# Patient Record
Sex: Male | Born: 1995 | Race: Black or African American | Hispanic: No | Marital: Single | Smoking: Current every day smoker
Health system: Southern US, Community
[De-identification: ages and names within clinical notes are randomized; demographics above are authoritative.]

## PROBLEM LIST (undated history)

## (undated) HISTORY — PX: OTHER SURGICAL HISTORY: SHX169

---
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
## 2017-01-06 ENCOUNTER — Encounter (HOSPITAL_COMMUNITY): Payer: Self-pay | Admitting: Emergency Medicine

## 2017-01-06 ENCOUNTER — Emergency Department (HOSPITAL_COMMUNITY)
Admission: EM | Admit: 2017-01-06 | Discharge: 2017-01-06 | Payer: Self-pay | Attending: Emergency Medicine | Admitting: Emergency Medicine

## 2017-01-06 DIAGNOSIS — S30851A Superficial foreign body of abdominal wall, initial encounter: Secondary | ICD-10-CM | POA: Insufficient documentation

## 2017-01-06 DIAGNOSIS — T754XXA Electrocution, initial encounter: Secondary | ICD-10-CM

## 2017-01-06 DIAGNOSIS — Y9389 Activity, other specified: Secondary | ICD-10-CM | POA: Insufficient documentation

## 2017-01-06 DIAGNOSIS — Y929 Unspecified place or not applicable: Secondary | ICD-10-CM | POA: Insufficient documentation

## 2017-01-06 DIAGNOSIS — Y998 Other external cause status: Secondary | ICD-10-CM | POA: Insufficient documentation
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
## 2017-04-24 ENCOUNTER — Other Ambulatory Visit: Payer: Self-pay

## 2017-04-24 ENCOUNTER — Emergency Department (HOSPITAL_COMMUNITY): Payer: Self-pay

## 2017-04-24 ENCOUNTER — Emergency Department (HOSPITAL_COMMUNITY)
Admission: EM | Admit: 2017-04-24 | Discharge: 2017-04-24 | Disposition: A | Discharge status: Home / Self Care | Payer: Self-pay | Attending: Emergency Medicine | Admitting: Emergency Medicine

## 2017-04-24 DIAGNOSIS — R7989 Other specified abnormal findings of blood chemistry: Secondary | ICD-10-CM | POA: Insufficient documentation

## 2017-04-24 DIAGNOSIS — R55 Syncope and collapse: Secondary | ICD-10-CM | POA: Insufficient documentation

## 2017-04-24 LAB — CBC
HCT: 41 % (ref 39.0–52.0)
Hemoglobin: 13.5 g/dL (ref 13.0–17.0)
MCH: 29.7 pg (ref 26.0–34.0)
MCHC: 32.9 g/dL (ref 30.0–36.0)
MCV: 90.3 fL (ref 78.0–100.0)
Platelets: 209 10*3/uL (ref 150–400)
RBC: 4.54 MIL/uL (ref 4.22–5.81)
RDW: 13.7 % (ref 11.5–15.5)
WBC: 6.2 10*3/uL (ref 4.0–10.5)

## 2017-04-24 LAB — URINALYSIS, ROUTINE W REFLEX MICROSCOPIC
BILIRUBIN URINE: NEGATIVE
Glucose, UA: NEGATIVE mg/dL
Hgb urine dipstick: NEGATIVE
KETONES UR: NEGATIVE mg/dL
LEUKOCYTES UA: NEGATIVE
NITRITE: NEGATIVE
PROTEIN: NEGATIVE mg/dL
Specific Gravity, Urine: 1.03 (ref 1.005–1.030)
pH: 6 (ref 5.0–8.0)

## 2017-04-24 LAB — BASIC METABOLIC PANEL
Anion gap: 11 (ref 5–15)
BUN: 15 mg/dL (ref 6–20)
CALCIUM: 9 mg/dL (ref 8.9–10.3)
CO2: 22 mmol/L (ref 22–32)
Chloride: 106 mmol/L (ref 101–111)
Creatinine, Ser: 1.72 mg/dL — ABNORMAL HIGH (ref 0.61–1.24)
GFR, EST NON AFRICAN AMERICAN: 55 mL/min — AB (ref >60)
Glucose, Bld: 91 mg/dL (ref 65–99)
Potassium: 4.1 mmol/L (ref 3.5–5.1)
SODIUM: 139 mmol/L (ref 135–145)

## 2017-04-24 LAB — CBG MONITORING, ED: GLUCOSE-CAPILLARY: 90 mg/dL (ref 65–99)

## 2017-04-24 MED ORDER — SODIUM CHLORIDE 0.9 % IV BOLUS (SEPSIS)
1000.0000 mL | Freq: Once | INTRAVENOUS | Status: AC
  Administered 2017-04-24: 1000 mL via INTRAVENOUS

## 2017-04-24 NOTE — ED Notes (Signed)
Patient denies pain and is resting comfortably.  

## 2017-04-24 NOTE — ED Provider Notes (Signed)
[facility] EMERGENCY DEPARTMENT Provider Note   CSN: [id number] Arrival date & time: 04/24/17  0105     History   Chief Complaint Chief Complaint  Patient presents with  . Loss of Consciousness    HPI Edwin Ellis is a 22 y.o. male.  Patient with history of Wolff-Parkinson-White, 3 previous ablations last performed 5 years ago --presents with complaint of syncopal episode.  Patient was at work when he began to feel lightheaded.  He excused himself from a meeting in route to the hall where he had a syncopal episode.  Fall was witnessed and patient did not hit his head.  He woke up about 15 seconds later and was not confused.  Patient does not report any associated chest pain or shortness of breath.  He was concerned that this was related to his previous WPW.  Patient states that he works at nighttime and does not been sleeping well.  He states that he has been eating and drinking well.  No reported fevers, cough, recent URI symptoms in the past month.  No nausea, vomiting, or diarrhea.  Patient states that he has been eating and drinking normally. The onset of this condition was acute. The course is constant. Aggravating factors: none. Alleviating factors: none.        No past medical history on file.  There are no active problems to display for this patient.   No past surgical history on file.     Home Medications    Prior to Admission medications   Not on File    Family History No family history on file.  Social History Social History   Tobacco Use  . Smoking status: Not on file  Substance Use Topics  . Alcohol use: Not on file  . Drug use: Not on file     Allergies   Penicillins   Review of Systems Review of Systems  Constitutional: Negative for fever.  HENT: Negative for rhinorrhea and sore throat.   Eyes: Negative for redness.  Respiratory: Negative for cough.   Cardiovascular: Negative for chest pain.  Gastrointestinal:  Negative for abdominal pain, diarrhea, nausea and vomiting.  Genitourinary: Negative for dysuria.  Musculoskeletal: Negative for myalgias.  Skin: Negative for rash.  Neurological: Positive for syncope and light-headedness. Negative for headaches.     Physical Exam Updated Vital Signs BP 116/68   Pulse 70   Temp 98.9 F (37.2 C) (Oral)   Resp 16   Ht 6\' 1"  (1.854 m)   Wt 86.2 kg (190 lb)   SpO2 98%   BMI 25.07 kg/m   Physical Exam  Constitutional: He appears well-developed and well-nourished.  HENT:  Head: Normocephalic and atraumatic.  Mouth/Throat: Mucous membranes are normal. Mucous membranes are not dry.  Eyes: Conjunctivae are normal.  Neck: Trachea normal and normal range of motion. Neck supple. Normal carotid pulses and no JVD present. No muscular tenderness present. Carotid bruit is not present. No tracheal deviation present.  Cardiovascular: Normal rate, regular rhythm, S1 normal, S2 normal, normal heart sounds and intact distal pulses. Exam reveals no distant heart sounds and no decreased pulses.  No murmur heard. Pulmonary/Chest: Effort normal and breath sounds normal. No respiratory distress. He has no wheezes. He exhibits no tenderness.  Abdominal: Soft. Normal aorta and bowel sounds are normal. There is no tenderness. There is no rebound and no guarding.  Musculoskeletal: He exhibits no edema.  Neurological: He is alert.  Skin: Skin is warm and dry. He  is not diaphoretic. No cyanosis. No pallor.  Psychiatric: He has a normal mood and affect.  Nursing note and vitals reviewed.    ED Treatments / Results  Labs (all labs ordered are listed, but only abnormal results are displayed) Labs Reviewed  BASIC METABOLIC PANEL - Abnormal; Notable for the following components:      Result Value   Creatinine, Ser 1.72 (*)    GFR calc non Af Amer 55 (*)    All other components within normal limits  CBC  URINALYSIS, ROUTINE W REFLEX MICROSCOPIC  CBG MONITORING, ED     EKG  EKG Interpretation  Date/Time:  Friday April 24 2017 01:06:04 EST Ventricular Rate:  73 PR Interval:    QRS Duration: 102 QT Interval:  376 QTC Calculation: 415 R Axis:   74 Text Interpretation:  Sinus rhythm Benign early repolarization Otherwise within normal limits Confirmed by Gilda CreasePollina, Christopher J [id number]) on 04/24/2017 2:37:47 AM       Radiology Dg Chest 2 View  Result Date: 04/24/2017 CLINICAL DATA:  Syncope.  History of atrial fibrillation. EXAM: CHEST - 2 VIEW COMPARISON:  None. FINDINGS: The cardiomediastinal contours are normal. The lungs are clear. Pulmonary vasculature is normal. No consolidation, pleural effusion, or pneumothorax. No acute osseous abnormalities are seen. IMPRESSION: No acute pulmonary process. Electronically Signed   By: Rubye OaksMelanie  Ehinger M.D.   On: 04/24/2017 03:08    Procedures Procedures (including critical care time)  Medications Ordered in ED Medications  sodium chloride 0.9 % bolus 1,000 mL (0 mLs Intravenous Stopped 04/24/17 0615)     Initial Impression / Assessment and Plan / ED Course  I have reviewed the triage vital signs and the nursing notes.  Pertinent labs & imaging results that were available during my care of the patient were reviewed by me and considered in my medical decision making (see chart for details).     Patient seen and examined. Work-up initiated. EKG reviewed with Dr. Blinda LeatherwoodPollina. Plan discussed.   Vital signs reviewed and are as follows: BP 115/62   Pulse 65   Temp 98.9 F (37.2 C) (Oral)   Resp 19   Ht 6\' 1"  (1.854 m)   Wt 86.2 kg (190 lb)   SpO2 99%   BMI 25.07 kg/m   6:42 AM patient stable in the emergency department after several hours of monitoring.  He has received 1 L normal saline.  We discussed elevated serum creatinine level.  This may be due to dehydration and patient encouraged to maintain good fluid hydration at home.  Discussed need to have this rechecked in the next 1-2 weeks.  Patient  verbalizes understanding.  Also discussed cardiology referral given history of WPW and no episode of syncope.  Patient strongly encouraged follow-up.  Encouraged return to the emergency department with new symptoms including chest pain, shortness of breath, additional episodes of passing out.  He verbalizes understanding and agrees with plan.  He is comfortable discharged home.  Final Clinical Impressions(s) / ED Diagnoses   Final diagnoses:  Syncope, unspecified syncope type  Elevated serum creatinine   Patient with syncopal episode with prodrome.  He does have history of WPW but no concerning EKG findings today.  He has no chest pain or findings consistent with pericarditis or myocarditis.  No recent viral illness.  No fluid overload on chest x-ray to suggest heart failure.  He is back to his baseline in emergency department.  No arrhythmias during several hours of monitoring in the emergency department.  He was found to have elevated serum creatinine.  This may be indicative of dehydration.  Patient was given IV fluids for this.  Discussed need to have this rechecked as above.  Return instructions as above.  Patient seems reliable to return with additional symptoms or worsening symptoms.   ED Discharge Orders    None       Renne Crigler, PA-C 04/24/17 0645    Gilda Crease, MD 04/24/17 [phone]

## 2017-04-24 NOTE — ED Triage Notes (Signed)
Pt arrived via [facility] from work; had episode of syncope with LOC, states episode felt like past experiences of AFIB for which he has had 3 ablations  Pt never in AFIB for [facility]; VS normal

## 2017-04-24 NOTE — Discharge Instructions (Signed)
Please read and follow all provided instructions.  Your diagnoses today include:  1. Syncope, unspecified syncope type   2. Elevated serum creatinine     Tests performed today include:  EKG -no sign of WPW today  Counts and electrolytes -elevated kidney function test, this will need to be rechecked by primary care doctor in the next 1-2 weeks  Vital signs. See below for your results today.    Medications prescribed:   None  Take any prescribed medications only as directed.  Home care instructions:  Follow any educational materials contained in this packet.  Follow-up instructions: Please follow-up with your primary care provider in the next 7 days for further evaluation of your symptoms.  You should also establish care with cardiologist given your history of arrhythmia and new episode of passing out.  Return instructions:   Please return to the Emergency Department if you experience worsening symptoms.   Return with chest pain, shortness of breath, additional episodes of passing out.   Please return if you have any other emergent concerns.  Additional Information:  Your vital signs today were: BP (!) 119/58    Pulse 65    Temp 98.9 F (37.2 C) (Oral)    Resp (!) 22    Ht 6\' 1"  (1.854 m)    Wt 86.2 kg (190 lb)    SpO2 99%    BMI 25.07 kg/m  If your blood pressure (BP) was elevated above 135/85 this visit, please have this repeated by your doctor within one month. --------------

## 2017-04-24 NOTE — ED Notes (Signed)
Delay in lab draw,  PA at bedside. 

## 2017-04-24 NOTE — ED Notes (Signed)
PA at bedside.

## 2017-04-24 NOTE — ED Notes (Signed)
Pt aware that we need urine sample. 

## 2017-05-01 NOTE — ED Notes (Signed)
Pt. Arrived and requested a new work note,.  Pt. Lost his original one.  Pt. Given another work note. 05/01/2017, 14:40

## 2018-11-17 IMAGING — CR DG CHEST 2V
2 series · 2 of 2 positions shown · non-contrast
Comparison: None.

CLINICAL DATA: Syncope.  History of atrial fibrillation.

EXAM:
CHEST - 2 VIEW

[chest lat]
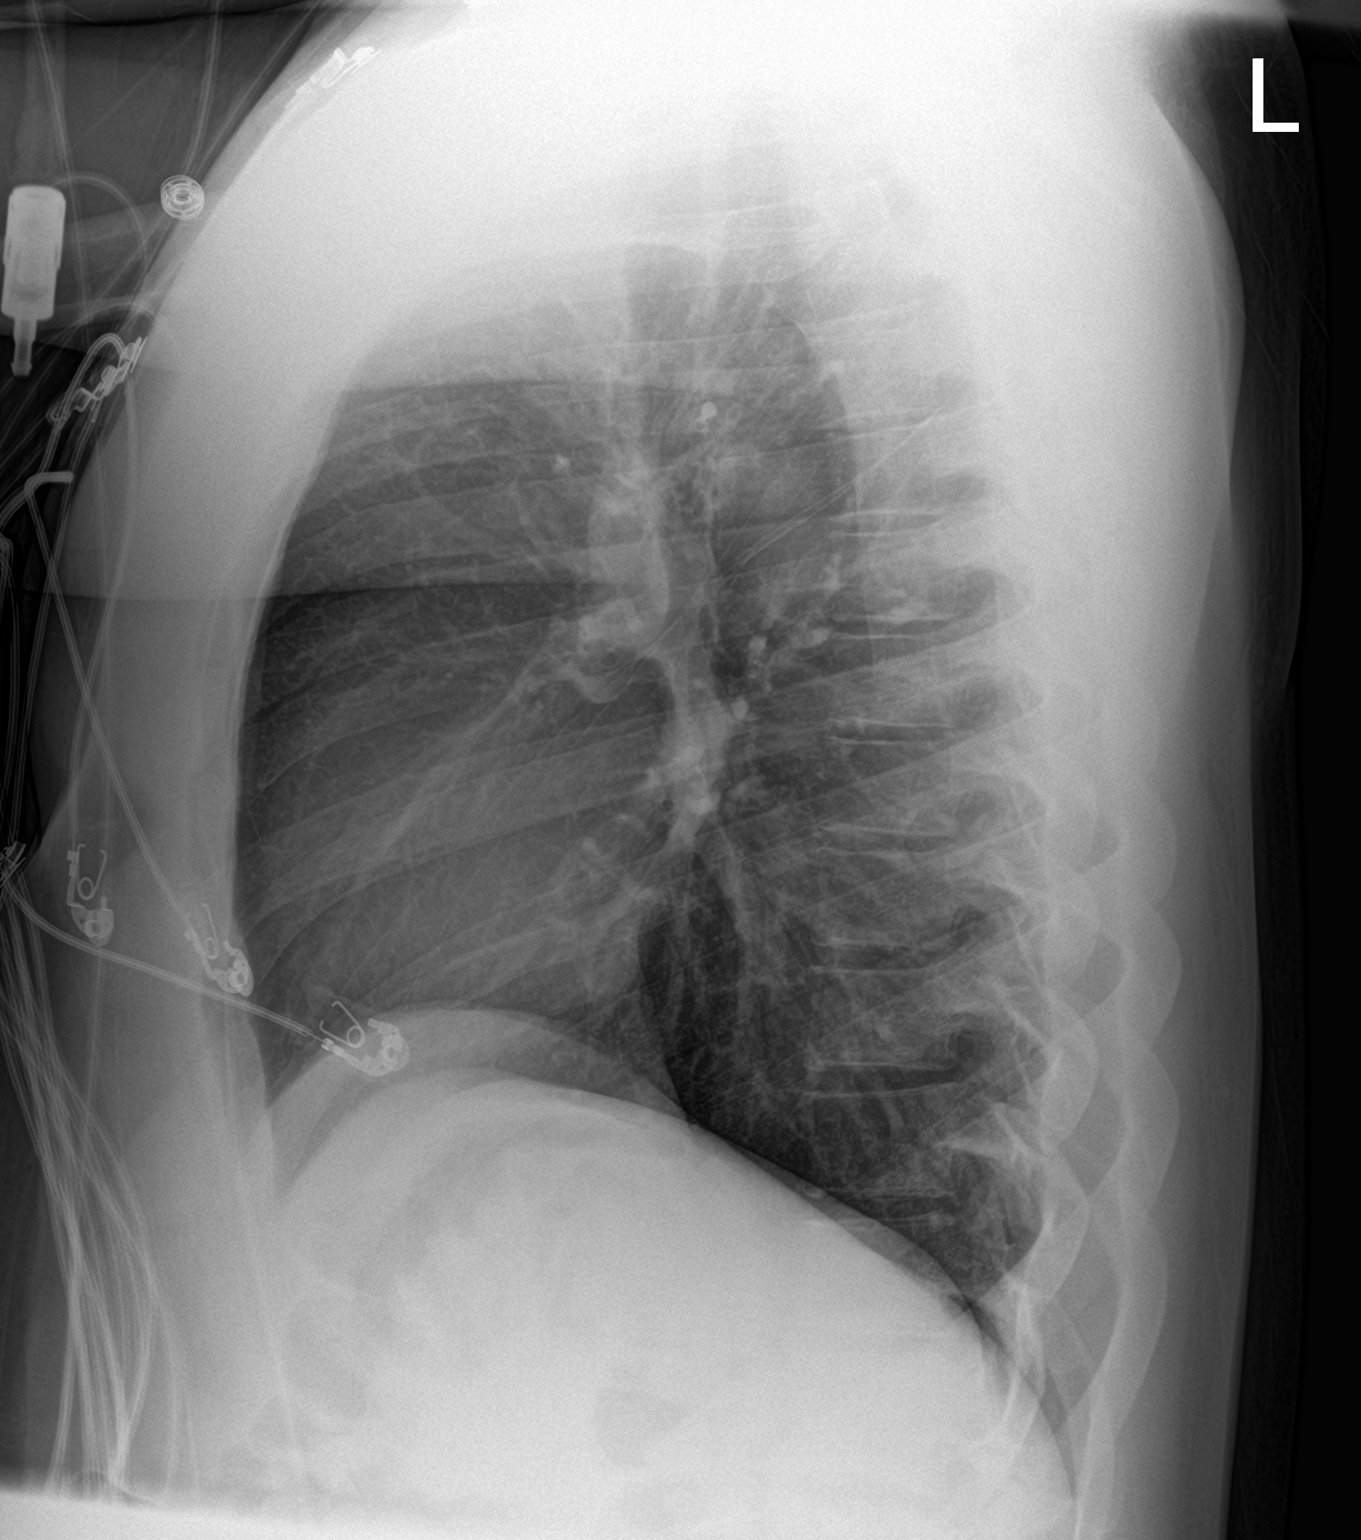

[chest ap]
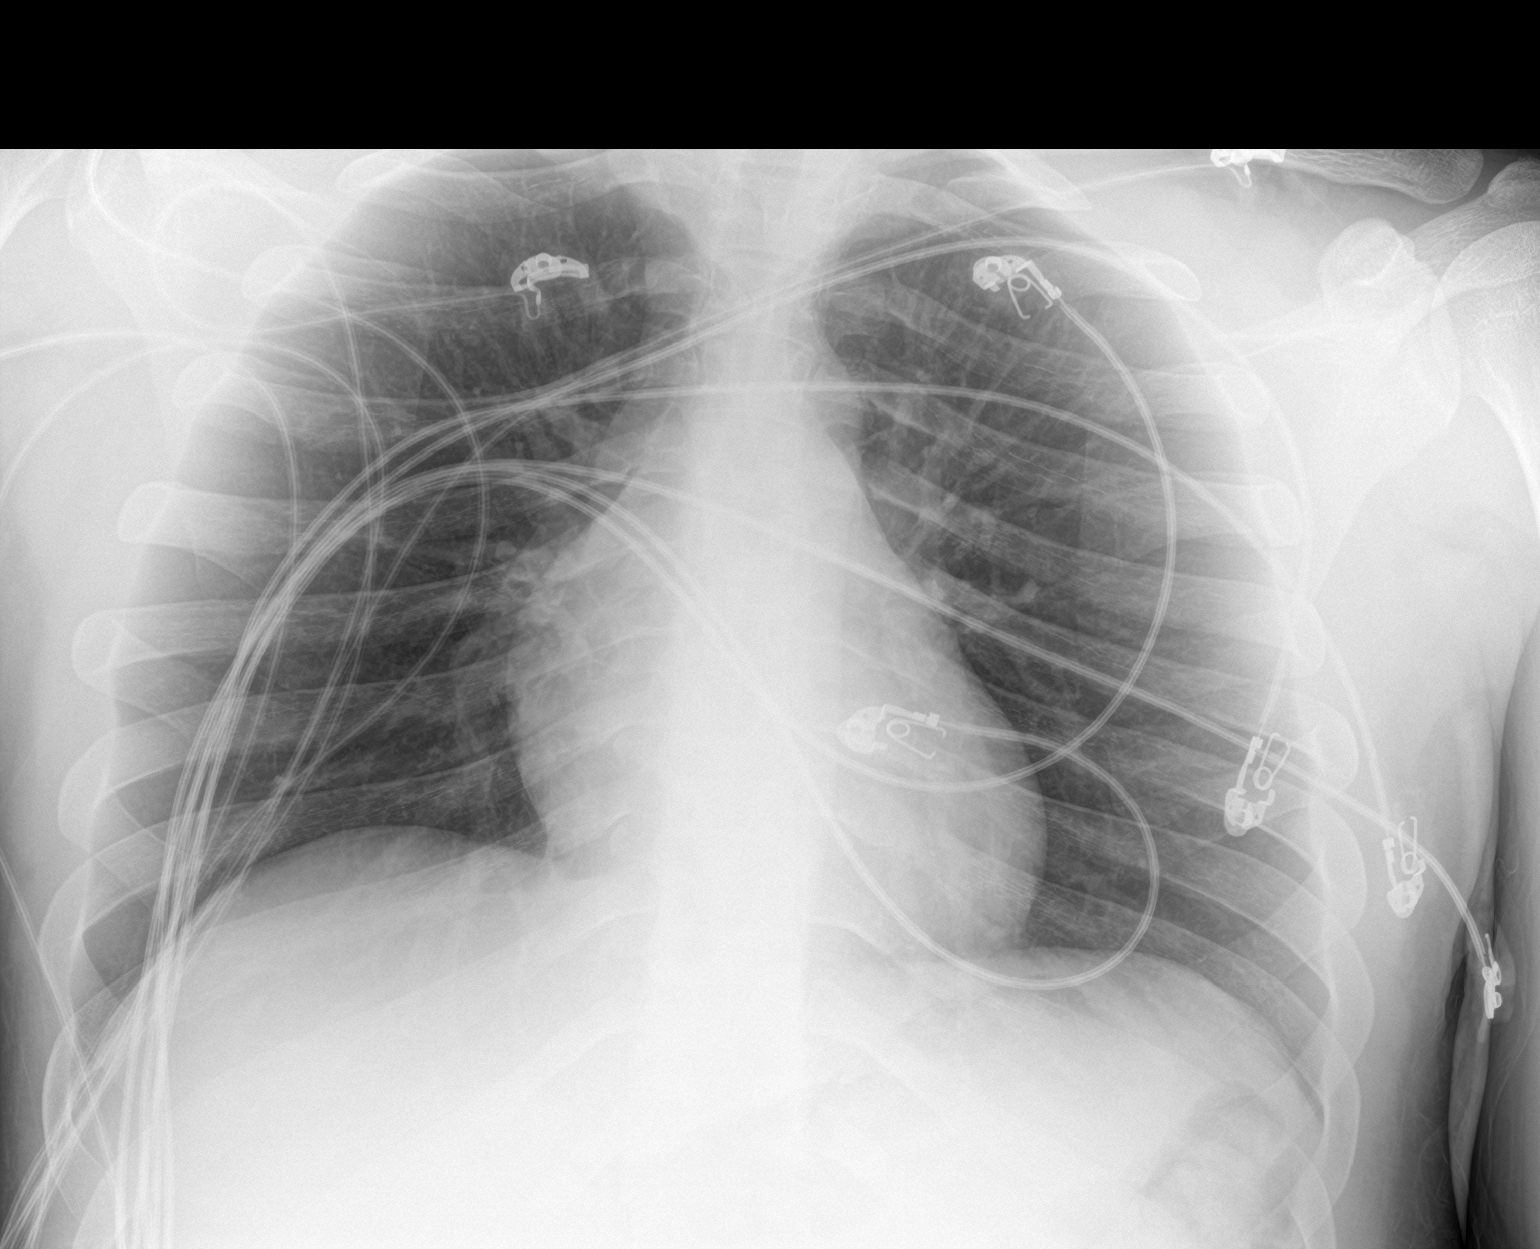

[2 of 2 positions shown; findings below may reference images not displayed]

FINDINGS: The cardiomediastinal contours are normal. The lungs are clear.
Pulmonary vasculature is normal. No consolidation, pleural effusion,
or pneumothorax. No acute osseous abnormalities are seen.
IMPRESSION: No acute pulmonary process.

## 2023-06-18 ENCOUNTER — Other Ambulatory Visit: Payer: Self-pay

## 2023-06-18 ENCOUNTER — Encounter (HOSPITAL_COMMUNITY): Payer: Self-pay

## 2023-06-18 ENCOUNTER — Ambulatory Visit (HOSPITAL_COMMUNITY)
Admission: EM | Admit: 2023-06-18 | Discharge: 2023-06-18 | Disposition: A | Discharge status: Home / Self Care | Attending: Emergency Medicine | Admitting: Emergency Medicine

## 2023-06-18 DIAGNOSIS — K0510 Chronic gingivitis, plaque induced: Secondary | ICD-10-CM | POA: Diagnosis not present

## 2023-06-18 MED ORDER — NYSTATIN 100000 UNIT/ML MT SUSP: 5.0000 mL | Freq: Four times a day (QID) | OROMUCOSAL | 0 refills | Status: DC | PRN

## 2023-06-18 MED ORDER — CHLORHEXIDINE GLUCONATE 0.12 % MT SOLN: 15.0000 mL | Freq: Two times a day (BID) | OROMUCOSAL | 0 refills | Status: DC
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
## 2023-06-18 NOTE — Discharge Instructions (Addendum)
 Avoid using charcoal toothpaste in the future to prevent any further irritation. Use Peridex  mouthwash twice daily. Use Magic mouthwash 4 times daily as needed for discomfort. Return here if symptoms persist or worsen.

## 2023-06-30 ENCOUNTER — Encounter (HOSPITAL_COMMUNITY): Payer: Self-pay | Admitting: *Deleted

## 2023-06-30 ENCOUNTER — Ambulatory Visit (HOSPITAL_COMMUNITY)
Admission: EM | Admit: 2023-06-30 | Discharge: 2023-06-30 | Disposition: A | Discharge status: Home / Self Care | Attending: Family Medicine | Admitting: Family Medicine

## 2023-06-30 DIAGNOSIS — Z113 Encounter for screening for infections with a predominantly sexual mode of transmission: Secondary | ICD-10-CM | POA: Diagnosis present

## 2023-06-30 NOTE — ED Triage Notes (Signed)
 Pt states he came a couple weeks ago for tingling in his mouth he has been using the meds given but its not helped any. He states he is also having a slight cough. He states he is having oral sex and wants STI testing orally and penile.

## 2023-06-30 NOTE — ED Provider Notes (Signed)
 [facility]   [id number] 06/30/23 Arrival Time: 1627  ASSESSMENT & PLAN:  1. Screening for STDs (sexually transmitted diseases)    No gum inflammation or signs of infection. Observation.    Discharge Instructions      We have sent testing for sexually transmitted infections. We will notify you of any positive results once they are received. If required, we will prescribe any medications you might need.  Please refrain from all sexual activity for at least the next seven days.    Oral and urethral cytology pending: Labs Reviewed  CYTOLOGY, (ORAL, ANAL, URETHRAL) ANCILLARY ONLY  CYTOLOGY, (ORAL, ANAL, URETHRAL) ANCILLARY ONLY    Will notify of any positive results. Instructed to refrain from sexual activity for at least seven days.  Reviewed expectations re: course of current medical issues. Questions answered. Outlined signs and symptoms indicating need for more acute intervention. Patient verbalized understanding. After Visit Summary given.   SUBJECTIVE:  Edwin Ellis is a 28 y.o. male who was here a couple weeks ago for tingling in his mouth. This has improved drastically but not completely gone. Denies fever. Occas ST. He states he is having oral sex and wants STI testing orally and penile.   Social History   Tobacco Use  Smoking Status Every Day   Types: Cigarettes  Smokeless Tobacco Never    OBJECTIVE:  Vitals:   06/30/23 1730  BP: 126/83  Pulse: (!) 58  Resp: 18  Temp: (!) 97.3 F (36.3 C)  TempSrc: Oral  SpO2: 98%    General appearance: alert, cooperative, appears stated age and no distress Throat: lips, mucosa, and tongue normal; teeth and gums normal Lungs: unlabored respirations; speaks full sentences without difficulty Back: no CVA tenderness; FROM at waist Abdomen: soft, non-tender GU: deferred Skin: warm and dry Psychological: alert and cooperative; normal mood and affect.    Labs Reviewed  CYTOLOGY, (ORAL, ANAL,  URETHRAL) ANCILLARY ONLY  CYTOLOGY, (ORAL, ANAL, URETHRAL) ANCILLARY ONLY    Allergies  Allergen Reactions   Penicillins Other (See Comments)    Childhood- reaction unknown  Has patient had a PCN reaction causing immediate rash, facial/tongue/throat swelling, SOB or lightheadedness with hypotension:  N/a Has patient had a PCN reaction causing severe rash involving mucus membranes or skin necrosis: n/a Has patient had a PCN reaction that required hospitalization: n/a Has patient had a PCN reaction occurring within the last 10 years: n/a If all of the above answers are "NO", then may proceed with Cephalosporin use.     History reviewed. No pertinent past medical history. History reviewed. No pertinent family history. Social History   Socioeconomic History   Marital status: Single    Spouse name: Not on file   Number of children: Not on file   Years of education: Not on file   Highest education level: Not on file  Occupational History   Not on file  Tobacco Use   Smoking status: Every Day    Types: Cigarettes   Smokeless tobacco: Never  Vaping Use   Vaping status: Never Used  Substance and Sexual Activity   Alcohol use: Yes   Drug use: Yes    Types: Marijuana   Sexual activity: Yes    Birth control/protection: None  Other Topics Concern   Not on file  Social History Narrative   Not on file   Social Drivers of Health   Financial Resource Strain: Not on file  Food Insecurity: Not on file  Transportation Needs: Not on file  Physical Activity: Not on file  Stress: Not on file  Social Connections: Not on file  Intimate Partner Violence: Not on file           Afton Albright, MD 06/30/23 [phone]

## 2023-06-30 NOTE — Discharge Instructions (Signed)
 We have sent testing for sexually transmitted infections. We will notify you of any positive results once they are received. If required, we will prescribe any medications you might need.  Please refrain from all sexual activity for at least the next seven days.

## 2023-07-02 ENCOUNTER — Other Ambulatory Visit: Payer: Self-pay

## 2023-07-02 ENCOUNTER — Ambulatory Visit (HOSPITAL_COMMUNITY)
Admission: EM | Admit: 2023-07-02 | Discharge: 2023-07-02 | Disposition: A | Discharge status: Home / Self Care | Attending: Emergency Medicine | Admitting: Emergency Medicine

## 2023-07-02 ENCOUNTER — Encounter (HOSPITAL_COMMUNITY): Payer: Self-pay | Admitting: *Deleted

## 2023-07-02 DIAGNOSIS — A749 Chlamydial infection, unspecified: Secondary | ICD-10-CM | POA: Diagnosis not present

## 2023-07-02 LAB — CYTOLOGY, (ORAL, ANAL, URETHRAL) ANCILLARY ONLY
Chlamydia: NEGATIVE
Chlamydia: POSITIVE — AB
Comment: NEGATIVE
Comment: NEGATIVE
Comment: NEGATIVE
Comment: NORMAL
Comment: NORMAL
Neisseria Gonorrhea: NEGATIVE
Neisseria Gonorrhea: NEGATIVE
Trichomonas: NEGATIVE

## 2023-07-02 MED ORDER — DOXYCYCLINE HYCLATE 100 MG PO CAPS: 100.0000 mg | ORAL_CAPSULE | Freq: Two times a day (BID) | ORAL | 0 refills | Status: AC

## 2023-07-02 NOTE — Discharge Instructions (Signed)
 Start taking doxycycline twice daily for 7 days for chlamydia. As recommended to avoid sexual intercourse while on treatment. Otherwise we always recommend using safe sex practices to avoid future STDs. If you would like retesting it is recommended to do this 4 weeks after completion of treatment. Return here as needed.

## 2023-07-02 NOTE — ED Provider Notes (Signed)
 [facility]    CSN: [id number] Arrival date & time: 07/02/23  1829      History   Chief Complaint Chief Complaint  Patient presents with   mouth    HPI Edwin Ellis is a 28 y.o. male.   Patient presents with questions regarding his test results.  Patient was seen here 2 days ago and was tested for STDs with oral and penile swab.  Patient states that he saw that he tested positive for chlamydia and was not sure if this was his oral or penile swab.  Patient has not yet been started on medication for STD.  Per chart review patient did test positive for chlamydia and has not yet received a prescription for antibiotic.  The history is provided by the patient and medical records.    History reviewed. No pertinent past medical history.  There are no active problems to display for this patient.   Past Surgical History:  Procedure Laterality Date   coronary ablasions         Home Medications    Prior to Admission medications   Medication Sig Start Date End Date Taking? Authorizing Provider  doxycycline (VIBRAMYCIN) 100 MG capsule Take 1 capsule (100 mg total) by mouth 2 (two) times daily for 7 days. 07/02/23 07/09/23 Yes Karon Packer, NP    Family History History reviewed. No pertinent family history.  Social History Social History   Tobacco Use   Smoking status: Every Day    Types: Cigarettes   Smokeless tobacco: Never  Vaping Use   Vaping status: Never Used  Substance Use Topics   Alcohol use: Yes   Drug use: Yes    Types: Marijuana     Allergies   Penicillins   Review of Systems Review of Systems  Per HPI  Physical Exam Triage Vital Signs ED Triage Vitals  Encounter Vitals Group     BP 07/02/23 1916 120/78     Systolic BP Percentile --      Diastolic BP Percentile --      Pulse Rate 07/02/23 1916 76     Resp 07/02/23 1916 20     Temp 07/02/23 1916 98.6 F (37 C)     Temp src --      SpO2 07/02/23 1916 96 %      Weight --      Height --      Head Circumference --      Peak Flow --      Pain Score 07/02/23 1915 0     Pain Loc --      Pain Education --      Exclude from Growth Chart --    No data found.  Updated Vital Signs BP 120/78   Pulse 76   Temp 98.6 F (37 C)   Resp 20   SpO2 96%   Visual Acuity Right Eye Distance:   Left Eye Distance:   Bilateral Distance:    Right Eye Near:   Left Eye Near:    Bilateral Near:     Physical Exam Vitals and nursing note reviewed.  Constitutional:      General: He is awake. He is not in acute distress.    Appearance: Normal appearance. He is well-developed and well-groomed. He is not ill-appearing.  Neurological:     Mental Status: He is alert.  Psychiatric:        Behavior: Behavior is cooperative.      UC Treatments / Results  Labs (all labs ordered are listed, but only abnormal results are displayed) Labs Reviewed - No data to display  EKG   Radiology No results found.  Procedures Procedures (including critical care time)  Medications Ordered in UC Medications - No data to display  Initial Impression / Assessment and Plan / UC Course  I have reviewed the triage vital signs and the nursing notes.  Pertinent labs & imaging results that were available during my care of the patient were reviewed by me and considered in my medical decision making (see chart for details).     Patient is well-appearing.  Vitals are stable.  Informed patient that his results are from his penile swab.  Prescribed doxycycline for chlamydia coverage.  Discussed follow-up and return precautions Final Clinical Impressions(s) / UC Diagnoses   Final diagnoses:  Chlamydia     Discharge Instructions      Start taking doxycycline twice daily for 7 days for chlamydia. As recommended to avoid sexual intercourse while on treatment. Otherwise we always recommend using safe sex practices to avoid future STDs. If you would like retesting it is  recommended to do this 4 weeks after completion of treatment. Return here as needed.  ED Prescriptions     Medication Sig Dispense Auth. Provider   doxycycline (VIBRAMYCIN) 100 MG capsule Take 1 capsule (100 mg total) by mouth 2 (two) times daily for 7 days. 14 capsule Levora Reas A, NP      PDMP not reviewed this encounter.   Levora Reas A, NP 07/02/23 [phone]

## 2023-07-02 NOTE — ED Triage Notes (Signed)
 PT reports he was tested orally and on penis for STD. Pt reports he still has tingling of mouth. PT reports positive test results for STD. PT not sure if the STD is in mouth or on penis.

## 2023-07-03 ENCOUNTER — Ambulatory Visit (HOSPITAL_COMMUNITY): Payer: Self-pay
